# Patient Record
Sex: Male | Born: 1985 | Race: White | Hispanic: No | Marital: Single | Smoking: Current every day smoker
Health system: Southern US, Community
[De-identification: ages and names within clinical notes are randomized; demographics above are authoritative.]

---
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
## 2005-11-03 ENCOUNTER — Emergency Department: Payer: Self-pay | Admitting: Unknown Physician Specialty

## 2010-05-30 ENCOUNTER — Emergency Department (HOSPITAL_COMMUNITY)
Admission: EM | Admit: 2010-05-30 | Discharge: 2010-05-30 | Disposition: A | Discharge status: Home / Self Care | Attending: Emergency Medicine | Admitting: Emergency Medicine

## 2010-05-30 ENCOUNTER — Emergency Department (HOSPITAL_COMMUNITY)

## 2010-05-30 DIAGNOSIS — Y9210 Unspecified residential institution as the place of occurrence of the external cause: Secondary | ICD-10-CM | POA: Insufficient documentation

## 2010-05-30 DIAGNOSIS — Y9359 Activity, other involving other sports and athletics played individually: Secondary | ICD-10-CM | POA: Insufficient documentation

## 2010-05-30 DIAGNOSIS — N509 Disorder of male genital organs, unspecified: Secondary | ICD-10-CM | POA: Insufficient documentation

## 2010-05-30 DIAGNOSIS — X500XXA Overexertion from strenuous movement or load, initial encounter: Secondary | ICD-10-CM | POA: Insufficient documentation

## 2010-05-30 DIAGNOSIS — R109 Unspecified abdominal pain: Secondary | ICD-10-CM | POA: Insufficient documentation

## 2010-05-30 DIAGNOSIS — R52 Pain, unspecified: Secondary | ICD-10-CM

## 2010-05-30 DIAGNOSIS — Y998 Other external cause status: Secondary | ICD-10-CM | POA: Insufficient documentation

## 2010-05-30 DIAGNOSIS — T148XXA Other injury of unspecified body region, initial encounter: Secondary | ICD-10-CM | POA: Insufficient documentation

## 2010-05-30 LAB — URINALYSIS, ROUTINE W REFLEX MICROSCOPIC
Bilirubin Urine: NEGATIVE
Glucose, UA: NEGATIVE mg/dL
Hgb urine dipstick: NEGATIVE
Ketones, ur: NEGATIVE mg/dL
Nitrite: NEGATIVE
Protein, ur: NEGATIVE mg/dL
Specific Gravity, Urine: 1.01 (ref 1.005–1.030)
Urobilinogen, UA: 0.2 mg/dL (ref 0.0–1.0)
pH: 5.5 (ref 5.0–8.0)

## 2010-05-30 LAB — DIFFERENTIAL
Basophils Absolute: 0 10*3/uL (ref 0.0–0.1)
Basophils Relative: 0 % (ref 0–1)
Eosinophils Absolute: 0.2 10*3/uL (ref 0.0–0.7)
Eosinophils Relative: 2 % (ref 0–5)
Lymphocytes Relative: 33 % (ref 12–46)
Lymphs Abs: 3.1 10*3/uL (ref 0.7–4.0)
Monocytes Absolute: 0.6 10*3/uL (ref 0.1–1.0)
Monocytes Relative: 7 % (ref 3–12)
Neutro Abs: 5.3 10*3/uL (ref 1.7–7.7)
Neutrophils Relative %: 58 % (ref 43–77)

## 2010-05-30 LAB — CBC
HCT: 44.2 % (ref 39.0–52.0)
Hemoglobin: 15.2 g/dL (ref 13.0–17.0)
MCH: 28.4 pg (ref 26.0–34.0)
MCHC: 34.4 g/dL (ref 30.0–36.0)
MCV: 82.5 fL (ref 78.0–100.0)
Platelets: 209 10*3/uL (ref 150–400)
RBC: 5.36 MIL/uL (ref 4.22–5.81)
RDW: 12.8 % (ref 11.5–15.5)
WBC: 9.2 10*3/uL (ref 4.0–10.5)

## 2010-05-30 LAB — BASIC METABOLIC PANEL
BUN: 8 mg/dL (ref 6–23)
CO2: 28 mEq/L (ref 19–32)
Calcium: 9.8 mg/dL (ref 8.4–10.5)
Chloride: 103 mEq/L (ref 96–112)
Creatinine, Ser: 0.87 mg/dL (ref 0.4–1.5)
GFR calc Af Amer: >60 mL/min (ref >60)
GFR calc non Af Amer: >60 mL/min (ref >60)
Glucose, Bld: 91 mg/dL (ref 70–99)
Potassium: 3.7 mEq/L (ref 3.5–5.1)
Sodium: 137 mEq/L (ref 135–145)

## 2011-12-02 ENCOUNTER — Ambulatory Visit: Payer: Self-pay | Admitting: General Practice

## 2011-12-02 LAB — COMPREHENSIVE METABOLIC PANEL
Albumin: 4.1 g/dL (ref 3.4–5.0)
Alkaline Phosphatase: 64 U/L (ref 50–136)
Anion Gap: 5 — ABNORMAL LOW (ref 7–16)
Bilirubin,Total: 0.5 mg/dL (ref 0.2–1.0)
Calcium, Total: 9.1 mg/dL (ref 8.5–10.1)
Chloride: 107 mmol/L (ref 98–107)
Co2: 28 mmol/L (ref 21–32)
EGFR (African American): >60
Potassium: 3.8 mmol/L (ref 3.5–5.1)
SGOT(AST): 33 U/L (ref 15–37)
SGPT (ALT): 52 U/L (ref 12–78)
Total Protein: 7.5 g/dL (ref 6.4–8.2)

## 2011-12-02 LAB — CBC
HCT: 46.7 % (ref 40.0–52.0)
HGB: 16.3 g/dL (ref 13.0–18.0)
RDW: 13.4 % (ref 11.5–14.5)
WBC: 13.2 10*3/uL — ABNORMAL HIGH (ref 3.8–10.6)

## 2011-12-02 LAB — PROTIME-INR
INR: 0.9
Prothrombin Time: 12.8 secs (ref 11.5–14.7)

## 2012-09-07 ENCOUNTER — Emergency Department: Payer: Self-pay | Admitting: Emergency Medicine

## 2012-09-07 LAB — COMPREHENSIVE METABOLIC PANEL
Alkaline Phosphatase: 67 U/L (ref 50–136)
Anion Gap: 3 — ABNORMAL LOW (ref 7–16)
Co2: 31 mmol/L (ref 21–32)
EGFR (Non-African Amer.): >60
Glucose: 103 mg/dL — ABNORMAL HIGH (ref 65–99)
Osmolality: 272 (ref 275–301)

## 2012-09-07 LAB — CBC
HGB: 16.7 g/dL (ref 13.0–18.0)
MCV: 83 fL (ref 80–100)
Platelet: 256 10*3/uL (ref 150–440)
RDW: 12.9 % (ref 11.5–14.5)

## 2012-09-07 LAB — URINALYSIS, COMPLETE
Bacteria: NONE SEEN
RBC,UR: <1 /HPF (ref 0–5)
Squamous Epithelial: NONE SEEN

## 2012-09-07 LAB — ETHANOL: Ethanol %: <0.003 % (ref 0.000–0.080)

## 2012-09-08 LAB — DRUG SCREEN, URINE
Barbiturates, Ur Screen: NEGATIVE (ref ?–200)
Cannabinoid 50 Ng, Ur ~~LOC~~: POSITIVE (ref ?–50)
Methadone, Ur Screen: NEGATIVE (ref ?–300)
Phencyclidine (PCP) Ur S: NEGATIVE (ref ?–25)

## 2012-10-02 IMAGING — US US SCROTUM
1 series · 14 of 25 positions shown · non-contrast
Comparison: 05/30/2010

CLINICAL DATA: Right scrotal pain

ULTRASOUND OF SCROTUM
ULTRASOUND DOPPLER SCROTUM
TECHNIQUE: Complete ultrasound examination of the testicles,
epididymis, and other scrotal structures was performed including
scrotal Doppler exam.

[Series 1: us scrotum · 0.08mm/px · 14 of 65 slices shown]
[im 1/65]
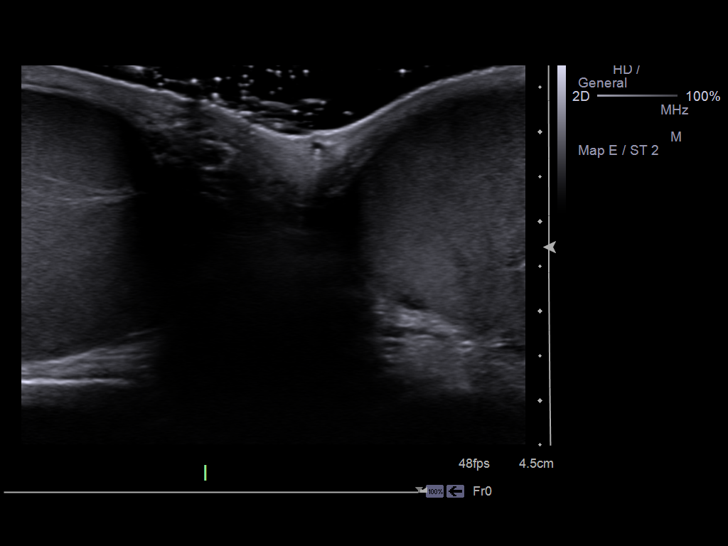
[im 6/65]
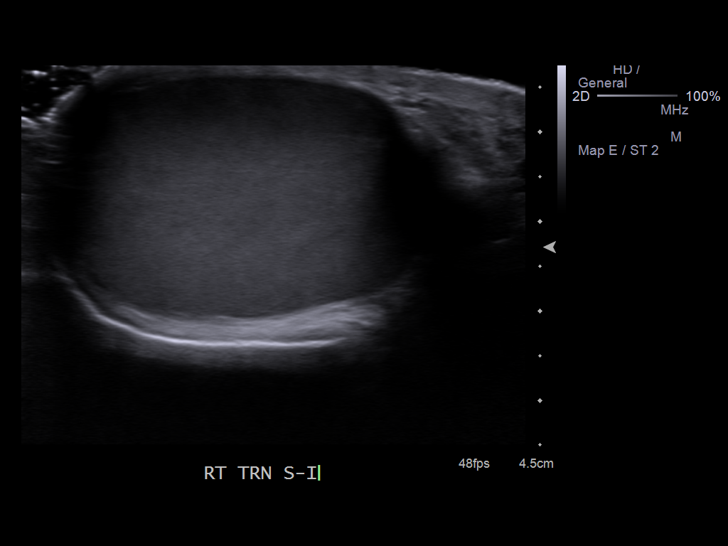
[im 11/65]
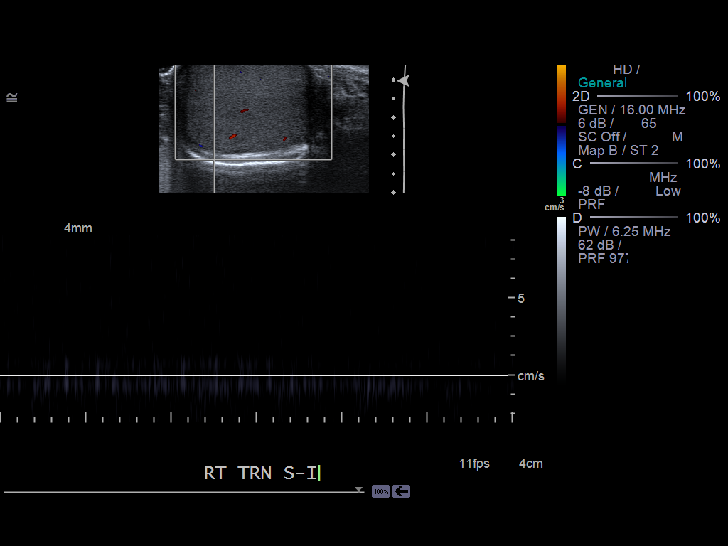
[im 17/65]
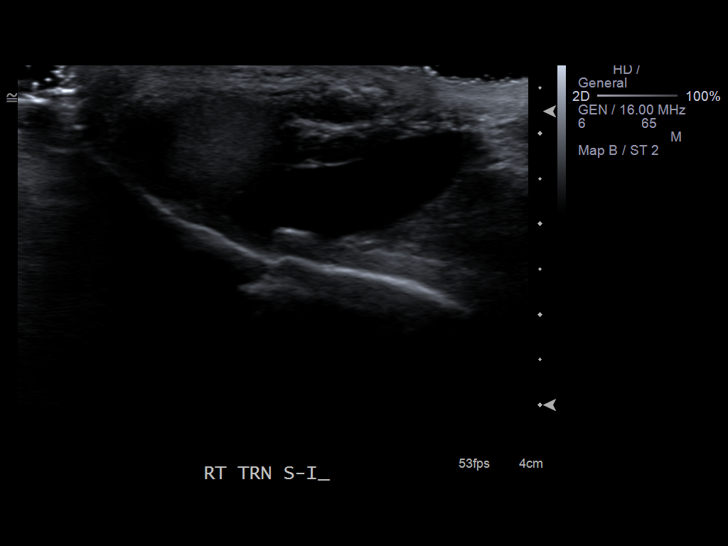
[im 22/65]
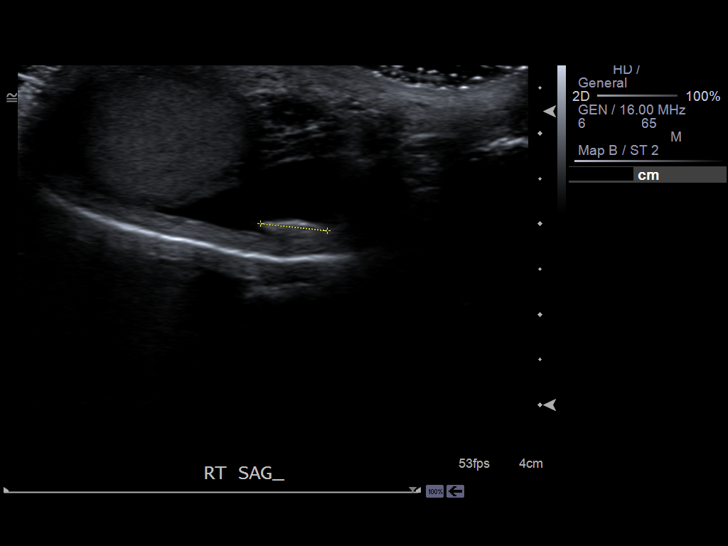
[im 25/65]
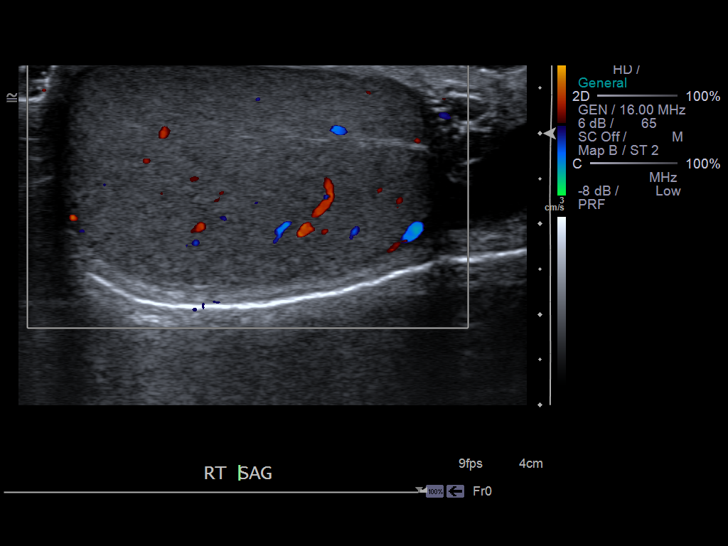
[im 30/65]
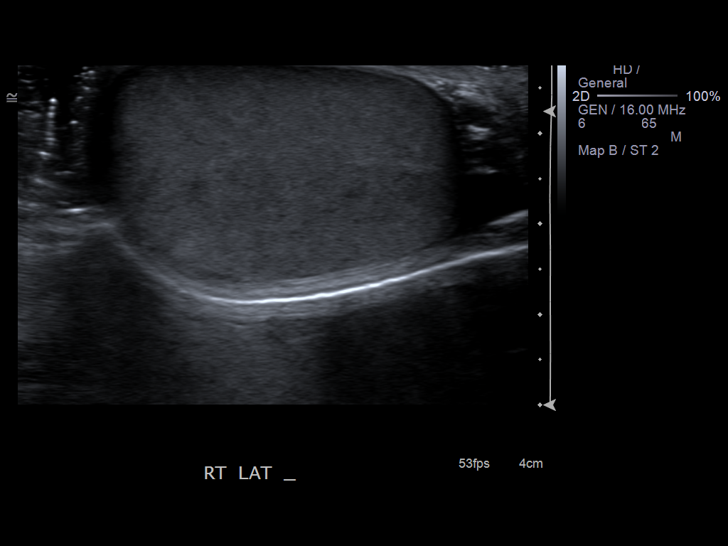
[im 35/65]
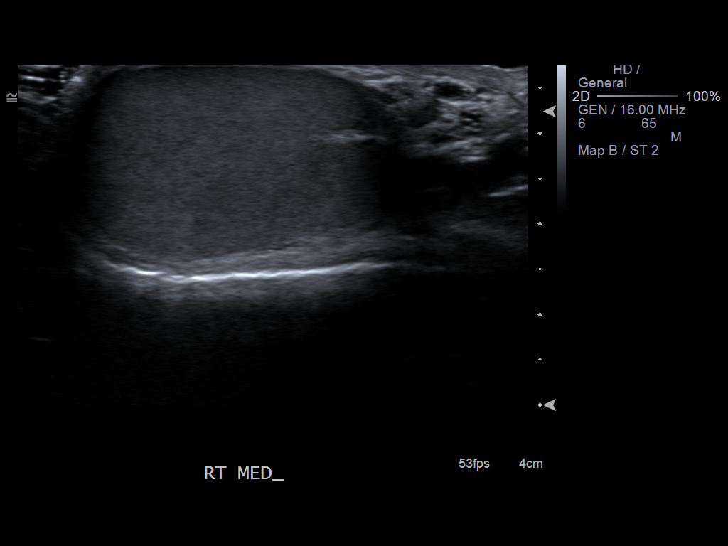
[im 41/65]
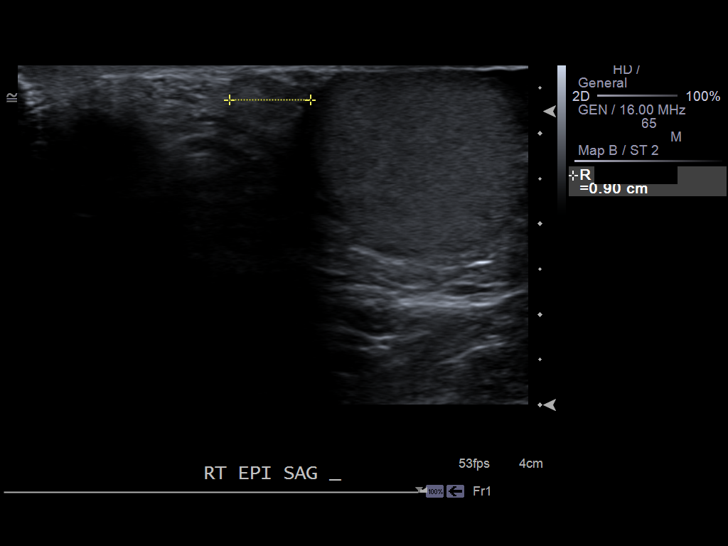
[im 43/65]
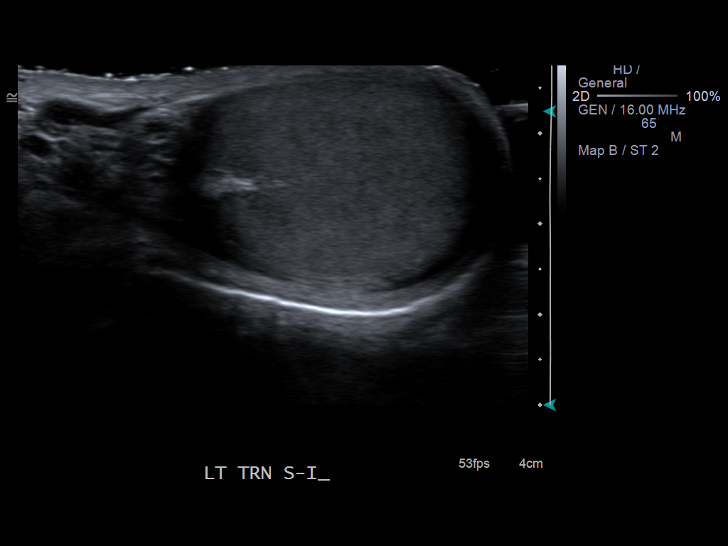
[im 49/65]
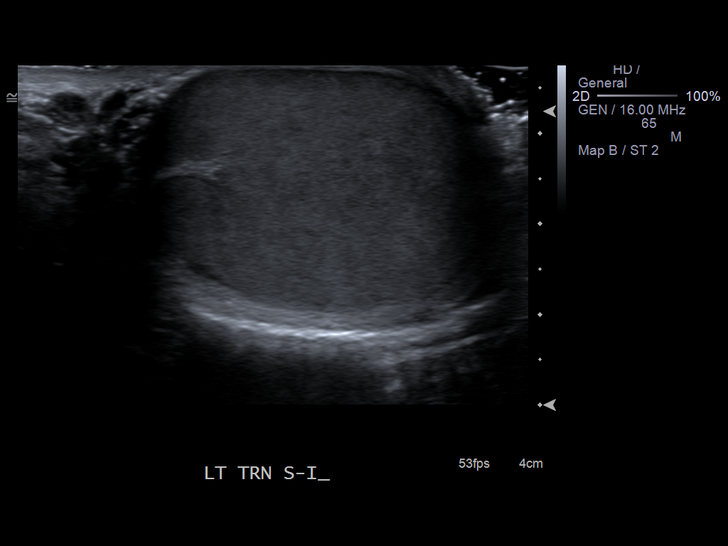
[im 54/65]
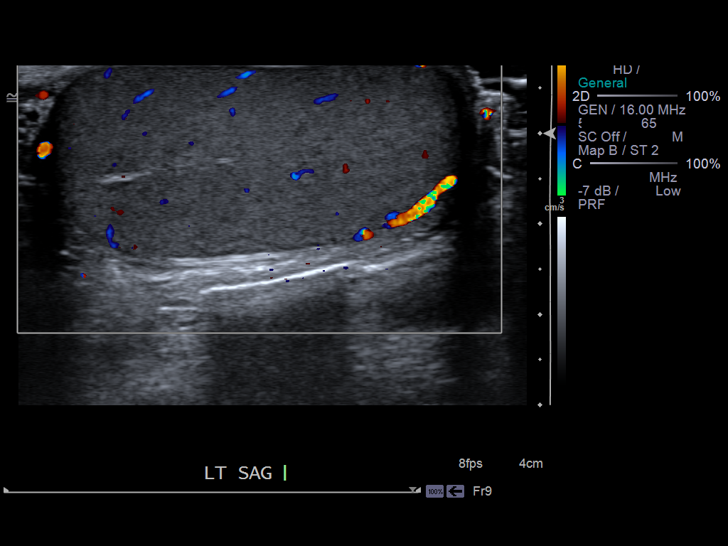
[im 59/65]
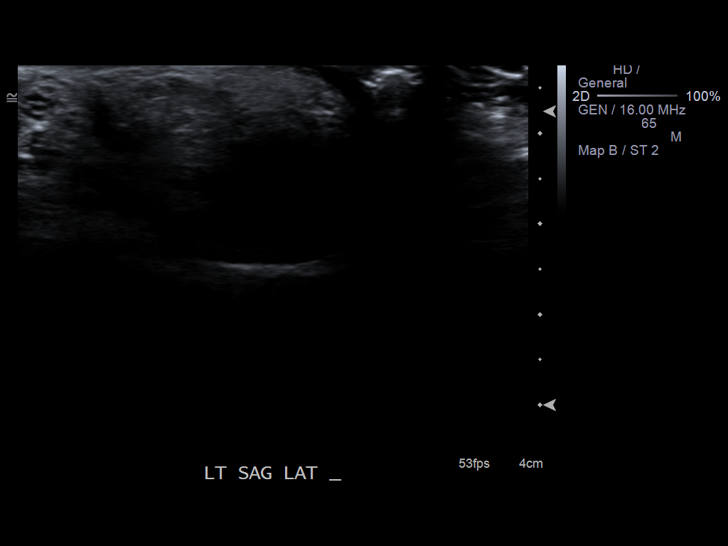
[im 65/65]
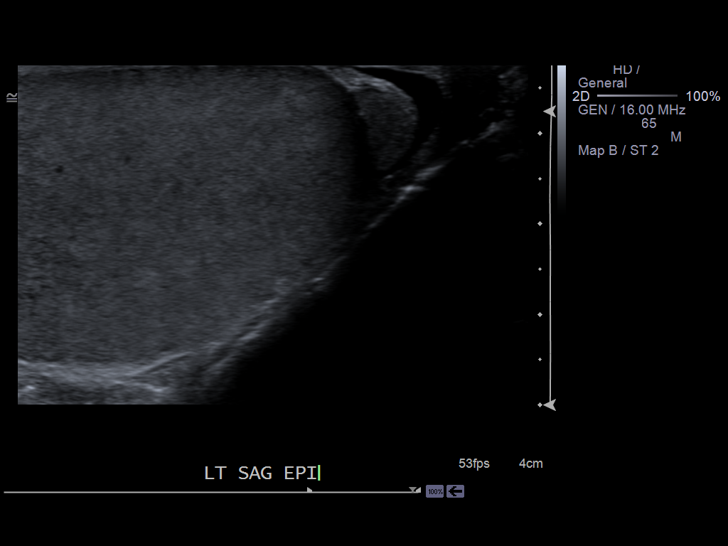

[14 of 25 positions shown; findings below may reference images not displayed]

FINDINGS: Right testis:  Normal homogeneous echotexture.  No intratesticular
mass or focal abnormality.  Right testicle measures 4.5 x 2.6 x
cm.  Normal color Doppler flow with arterial and venous waveforms
detected.

Left testis:  Normal homogeneous echotexture.  No intratesticular
mass or focal abnormality.  Left testicle measures 4.5 x 2.6 x
cm also.  Normal color Doppler flow and arterial and venous
waveforms detected.

Right epididymis:  Normal in size and appearance.

Left epididymis:  Normal in size and appearance.

Hydrocele:  Small hydrocele on the right noted which appears
simple.

Varicocele:  Absent

Echogenic shadowing right hemi scrotum calcification noted
measuring 7 mm consistent with a "scrotal pearl".

Pulsed Doppler interrogatioin of both testes demonstrates low
resistance flow bilaterally.
IMPRESSION: No acute finding by ultrasound.
Trace right hydrocele
No evidence of torsion

## 2014-04-06 IMAGING — CR RIGHT HAND - COMPLETE 3+ VIEW
1 series · 3 of 3 positions shown · non-contrast
Comparison: none

REASON FOR EXAM: s/p accident
COMMENTS:

[Series 1: pa · 0.17mm/px · 3 of 3 slices shown]
[im 1/3]
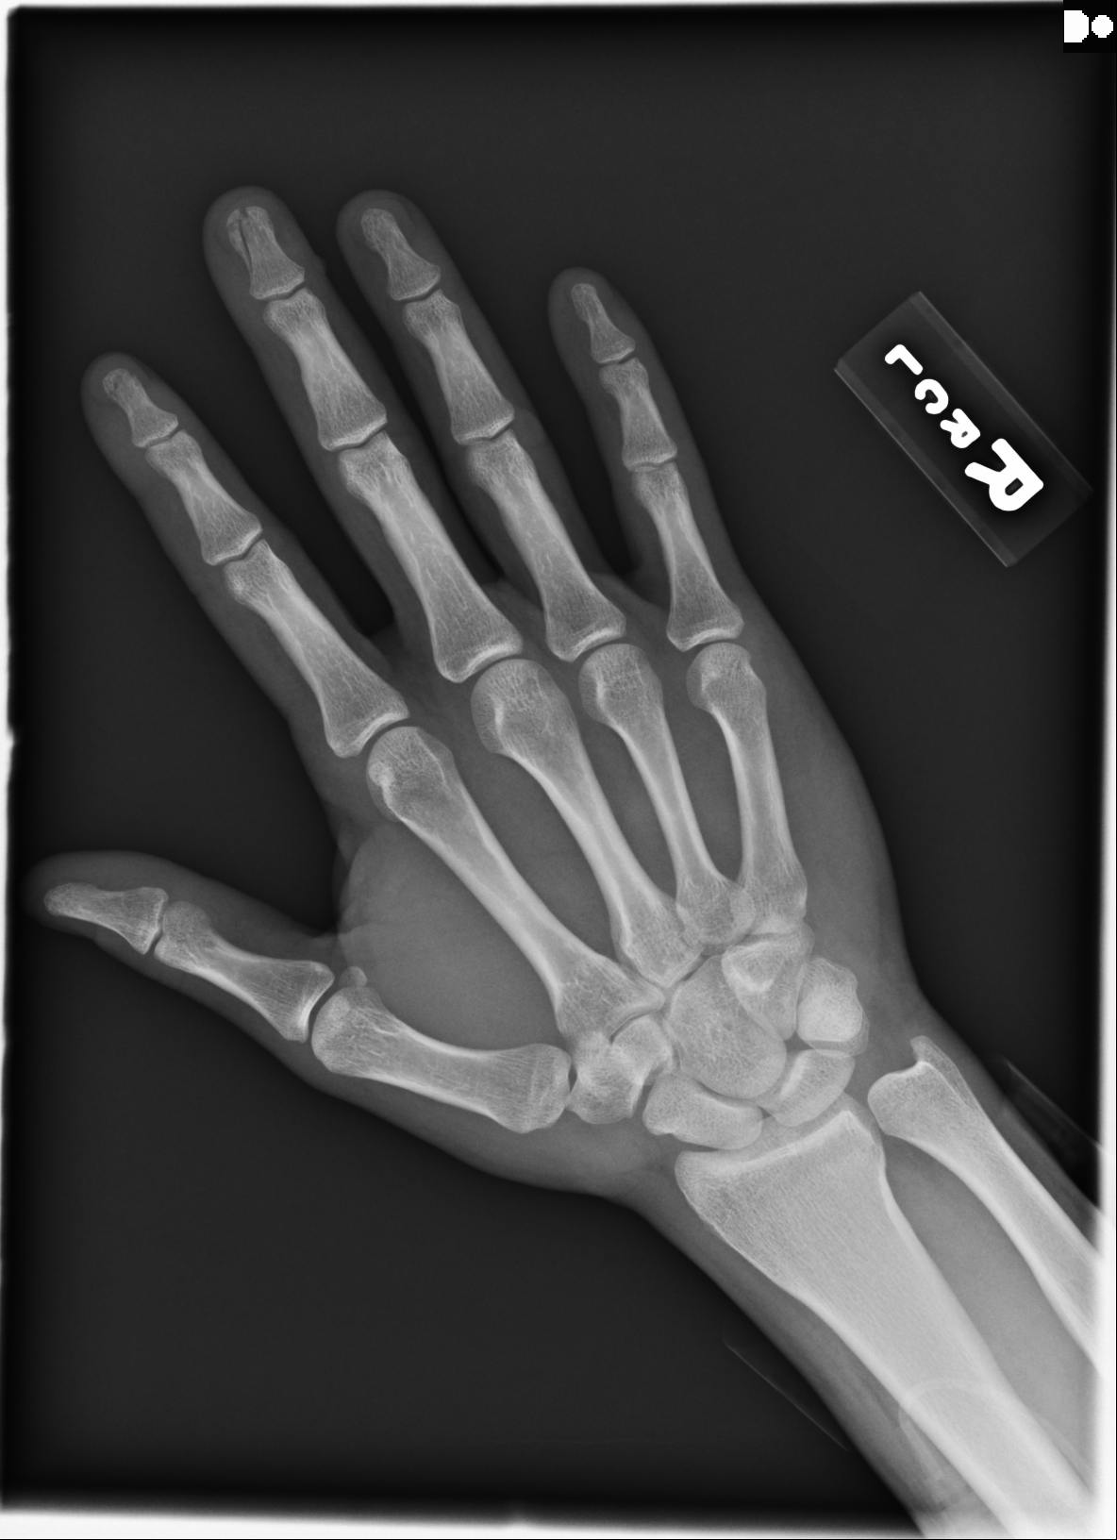
[im 2/3]
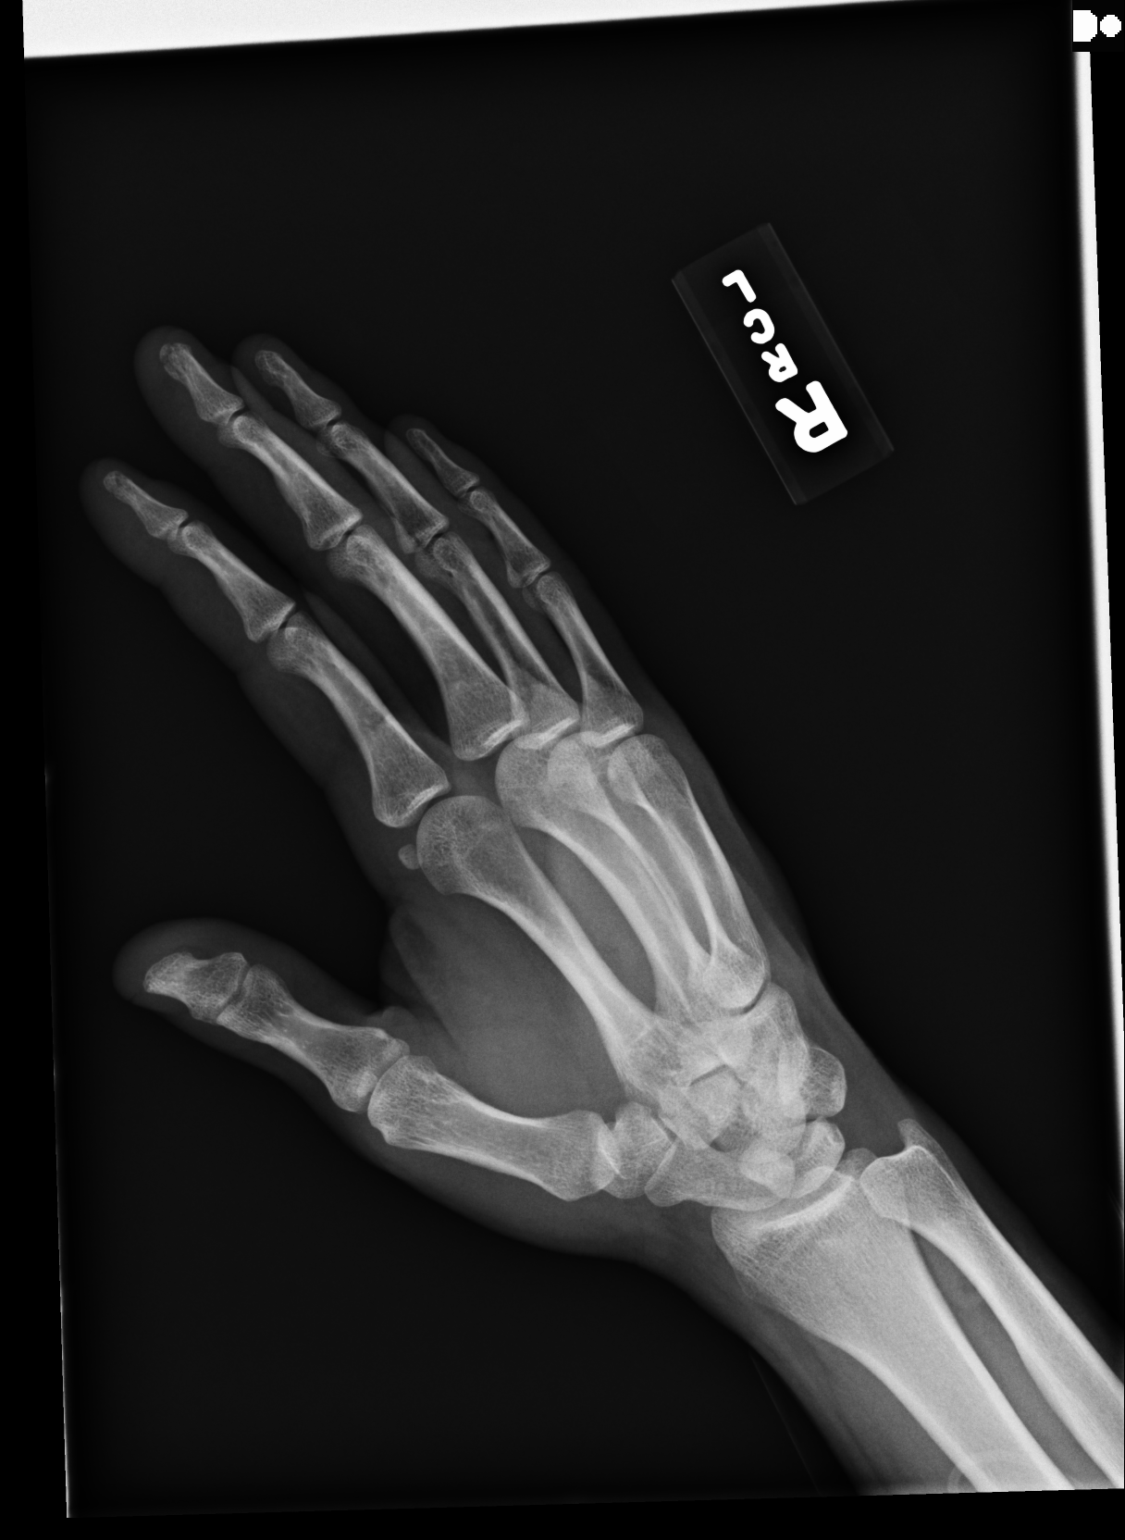
[im 3/3]
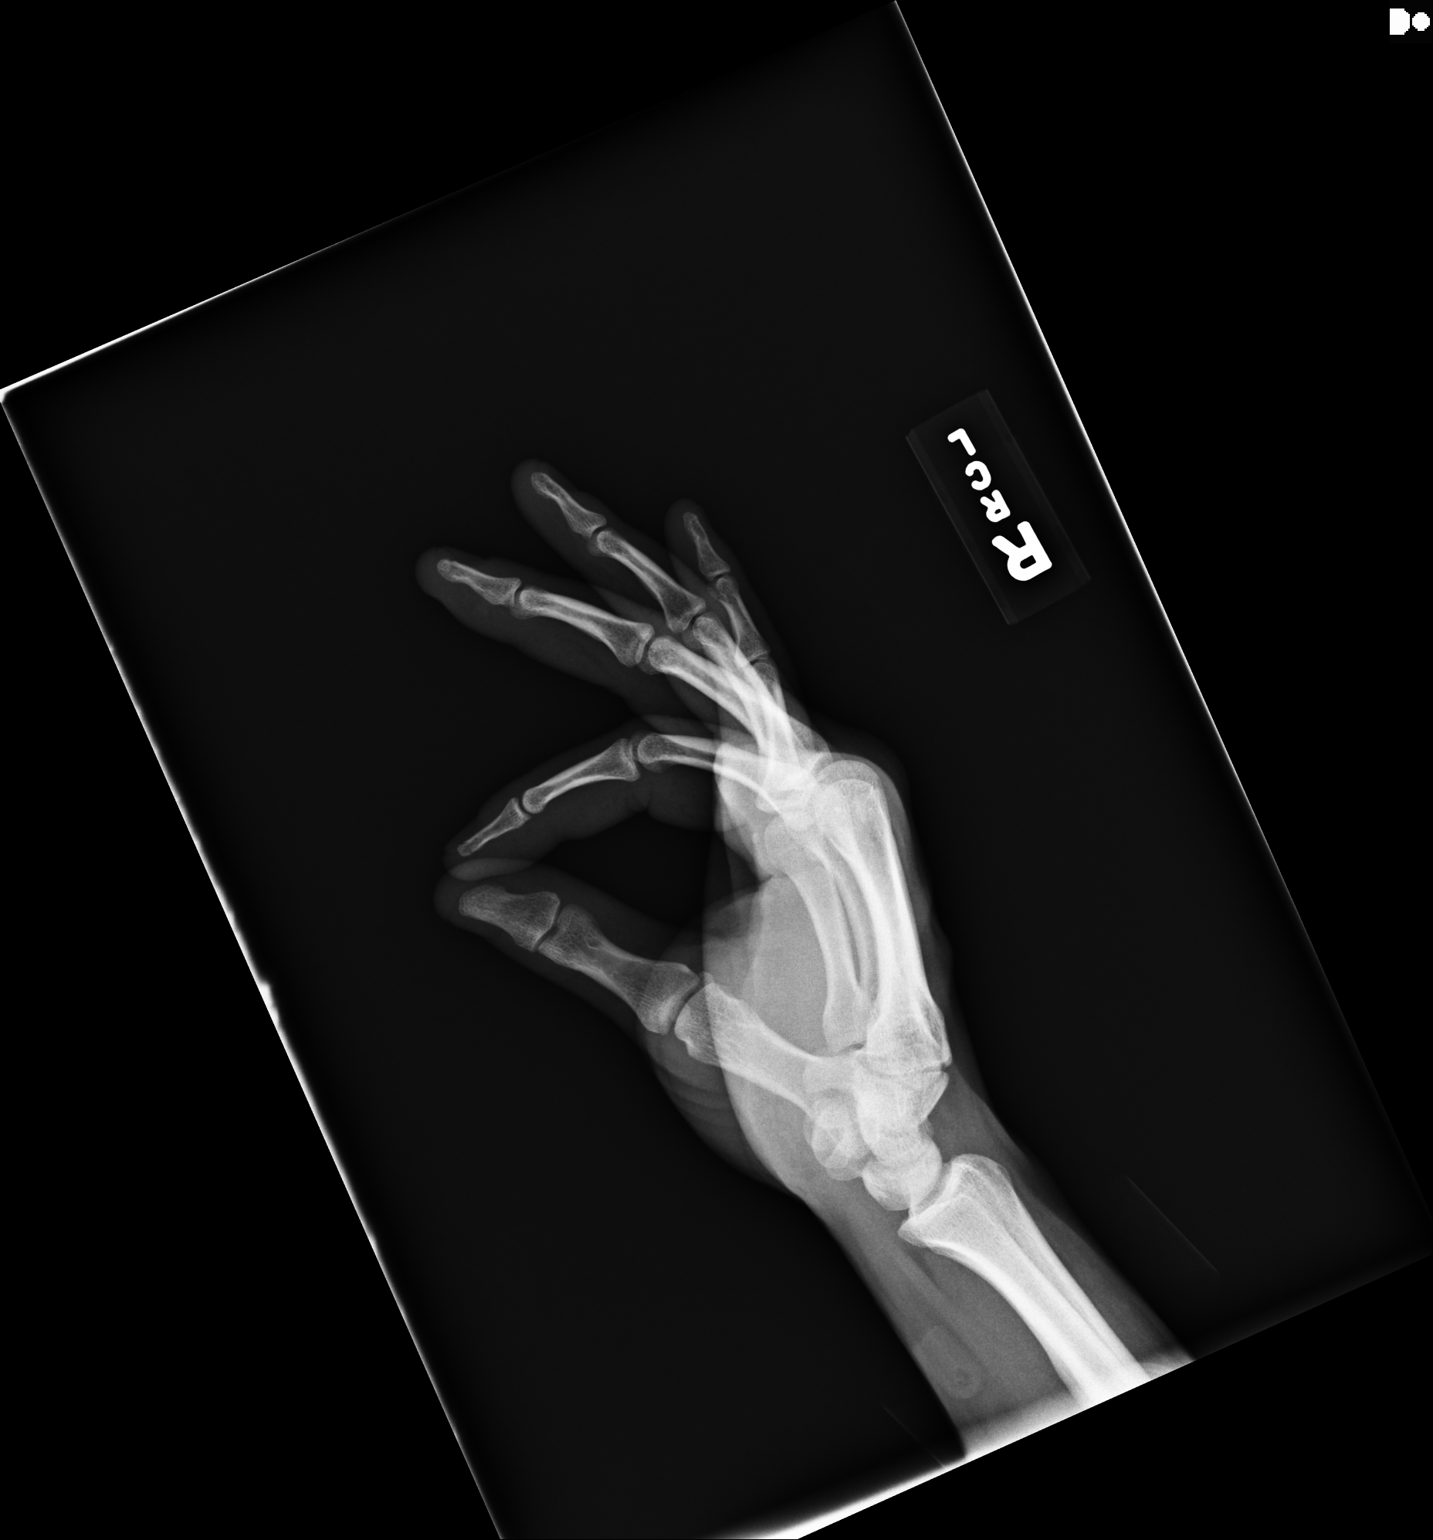

[3 of 3 positions shown; findings below may reference images not displayed]

PROCEDURE:     DXR - DXR HAND RT COMPLETE W/OBLIQUES  - December 02, 2011  [DATE]

RESULT:     Three views of the right hand are submitted. The patient has
sustained obliquely oriented fractures through the distal aspects of the
distal phalanges of the index and third finger. The overlying soft tissues
are grossly intact. The proximal and middle phalanges appear intact as do
the carpal bones and metacarpals.
IMPRESSION: The patient has sustained a nondisplaced obliquely oriented
fracture through the tips of the distal phalanges of the second and third
fingers.

[REDACTED]

## 2014-05-10 NOTE — Op Note (Signed)
PATIENT NAME:  Athena MasseBOSWELL, Misael MR#:  [id number] DATE OF BIRTH:  [birth year]  DATE OF PROCEDURE:  12/02/2011  PREOPERATIVE DIAGNOSIS: Crush injury to the distal aspect of the left long finger.   POSTOPERATIVE DIAGNOSIS:  Crush injury to the distal aspect of the left long finger.    PROCEDURE PERFORMED: Irrigation and debridement of the distal aspect of the left long finger.   SURGEON: Illene LabradorJames P. Hooten, MD   ANESTHESIA: General.   ESTIMATED BLOOD LOSS: Minimal.   FLUIDS REPLACED: 1100 mL of crystalloid.   TOURNIQUET TIME: Not used.   DRAINS: None.   INDICATIONS FOR SURGERY: The patient is a 29 year old right-hand dominant male who sustained an injury at work when a log apparently fell on the distal aspect of the left long finger. [facility] with an open lesion. The nail was intact for most of its extent. After discussion of the risks and benefits of surgical intervention, the patient expressed his understanding of the risks and benefits and agreed with plans for surgical intervention.   PROCEDURE IN DETAIL: The patient was brought into the operating room and, after adequate general anesthesia was achieved, a tourniquet was placed on the patient's upper left arm. The patient's left hand and arm were cleaned and prepped with Betadine and draped in the usual sterile fashion. A "time-out" was performed as per usual protocol. Loupe magnification was used throughout procedure. The volar soft tissue flap was elevated and the wound was irrigated with copious amounts of normal saline with antibiotic solution using an Angiocath catheter. The distal aspect of the distal phalanx was visible and somewhat prominent. A rongeur was used to trim and debride the distal aspect of the distal phalanx of the left long finger. There had been some disruption of the nail matrix and tenotomy scissors were used to remove the fragmented portion. The wound was again irrigated with copious amounts of  normal saline with antibiotic solution. The flap was then loosely reapproximated using interrupted sutures of #4-0 nylon. Good coverage of the distal phalanx was appreciated. A digital block was completed using 0.5% Marcaine. Sterile dressing was applied.   The patient tolerated the procedure well. He was transported to the recovery room in stable condition.     ____________________________ Illene LabradorJames P. Angie FavaHooten Jr., MD jph:bjt D: 12/03/2011 02:53:16 ET T: 12/03/2011 10:54:59 ET JOB#: [id number]  cc: Fayrene FearingJames P. Angie FavaHooten Jr., MD, <Dictator> Illene LabradorJAMES P Angie FavaHOOTEN JR MD ELECTRONICALLY SIGNED 12/04/2011 4:43

## 2015-03-31 ENCOUNTER — Emergency Department
Admission: EM | Admit: 2015-03-31 | Discharge: 2015-03-31 | Discharge status: Against Medical Advice | Attending: Emergency Medicine | Admitting: Emergency Medicine

## 2015-03-31 ENCOUNTER — Encounter: Payer: Self-pay | Admitting: Medical Oncology

## 2015-03-31 DIAGNOSIS — F17200 Nicotine dependence, unspecified, uncomplicated: Secondary | ICD-10-CM | POA: Insufficient documentation

## 2015-03-31 DIAGNOSIS — B349 Viral infection, unspecified: Secondary | ICD-10-CM | POA: Insufficient documentation

## 2015-03-31 LAB — RAPID INFLUENZA A&B ANTIGENS
Influenza A (ARMC): NEGATIVE
Influenza B (ARMC): NEGATIVE

## 2015-03-31 MED ORDER — IBUPROFEN 600 MG PO TABS
600.0000 mg | ORAL_TABLET | Freq: Once | ORAL | Status: AC
  Administered 2015-03-31: 600 mg via ORAL
  Filled 2015-03-31: qty 1

## 2015-03-31 NOTE — ED Notes (Signed)
Patient c/o body aches, headache, congestion, nausea, cough, chills.

## 2015-03-31 NOTE — ED Notes (Signed)
Pt reports he has been having cough, fever, body aches since yesterday.

## 2015-03-31 NOTE — ED Provider Notes (Signed)
[facility] Emergency Department Provider Note ____________________________________________  Time seen: Approximately 9:32 AM  I have reviewed the triage vital signs and the nursing notes.   HISTORY  Chief Complaint Cough and Fever   HPI Timothy Rangel is a 30 y.o. male is here with complaint of body aches, headache, congestion, nausea, cough and chills. He says states that he did not get the flu shot. He states that congestion came on gradually however his body aches and fever or sudden onset yesterday. He has taken over-the-counter medication with minimal results. He states last night he had tremendous body aches. He denies any nausea or vomiting. There is been no diarrhea. Currently he rates his pain as 4 out of 10.   No past medical history on file.  There are no active problems to display for this patient.   No past surgical history on file.  No current outpatient prescriptions on file.  Allergies Review of patient's allergies indicates no known allergies.  No family history on file.  Social History Social History  Substance Use Topics  . Smoking status: Current Every Day Smoker  . Smokeless tobacco: Not on file  . Alcohol Use: Not on file    Review of Systems Constitutional: Positive fever/chills Eyes: No visual changes. ENT: No sore throat. Cardiovascular: Denies chest pain. Respiratory: Denies shortness of breath. Gastrointestinal: No abdominal pain.  No nausea, no vomiting.  No diarrhea.   Genitourinary: Negative for dysuria. Musculoskeletal: Positive for body aches Skin: Negative for rash. Neurological: Positive for headaches, no focal weakness or numbness.  10-point ROS otherwise negative.  ____________________________________________   PHYSICAL EXAM:  VITAL SIGNS: ED Triage Vitals  Enc Vitals Group     BP 03/31/15 0730 149/89 mmHg     Pulse Rate 03/31/15 0730 87     Resp 03/31/15 0730 18     Temp 03/31/15 0730 97.7  F (36.5 C)     Temp Source 03/31/15 0730 Oral     SpO2 03/31/15 0730 99 %     Weight 03/31/15 0730 175 lb (79.379 kg)     Height 03/31/15 0730  (1.803 m)     Head Cir --      Peak Flow --      Pain Score 03/31/15 0730 4     Pain Loc --      Pain Edu? --      Excl. in GC? --     Constitutional: Alert and oriented. Well appearing and in no acute distress. Eyes: Conjunctivae are normal. PERRL. EOMI. Head: Atraumatic. Nose: Moderate congestion/rhinnorhea. Mouth/Throat: Mucous membranes are moist.  Oropharynx non-erythematous. Neck: No stridor.   Hematological/Lymphatic/Immunilogical: No cervical lymphadenopathy. Cardiovascular: Normal rate, regular rhythm. Grossly normal heart sounds.  Good peripheral circulation. Respiratory: Normal respiratory effort.  No retractions. Lungs CTAB. Gastrointestinal: Soft and nontender. No distention.  Musculoskeletal: Moves upper and lower extremities without difficulty. Normal gait was noted. Neurologic:  Normal speech and language. No gross focal neurologic deficits are appreciated. No gait instability. Skin:  Skin is warm, dry and intact. No rash noted. Psychiatric: Mood and affect are normal. Speech and behavior are normal.  ____________________________________________   LABS (all labs ordered are listed, but only abnormal results are displayed)  Labs Reviewed  RAPID INFLUENZA A&B ANTIGENS ([facility] ONLY)    PROCEDURES  Procedure(s) performed: None  Critical Care performed: No  ____________________________________________   INITIAL IMPRESSION / ASSESSMENT AND PLAN / ED COURSE  Pertinent labs & imaging results that were available  during my care of the patient were reviewed by me and considered in my medical decision making (see chart for details).  Patient left AMA prior to his flu test results findings. Patient was called at home and told that his influenza test was negative. He will continue on his over-the-counter medication  for his congestion and Tylenol or ibuprofen as needed for body aches and chills. ____________________________________________   FINAL CLINICAL IMPRESSION(S) / ED DIAGNOSES  Final diagnoses:  Viral illness      Tommi RumpsRhonda L Summers, PA-C 03/31/15 1217  Sharyn CreamerMark Quale, MD 03/31/15 [phone]

## 2016-03-15 ENCOUNTER — Telehealth (HOSPITAL_COMMUNITY): Payer: Self-pay | Admitting: Licensed Clinical Social Worker

## 2016-03-15 ENCOUNTER — Ambulatory Visit (HOSPITAL_COMMUNITY): Admitting: Licensed Clinical Social Worker

## 2016-03-15 NOTE — Telephone Encounter (Signed)
Patient called to say he overslept and will not be able to make appointment. Counselor rescheduled for Monday 2/26 at 10AM for [facility]. Pt stated he is interested in MAT and/or sobriety based services. Counselor advised him to attend an AA or NA meeting this weekend to meet others in recovery.

## 2016-03-18 ENCOUNTER — Ambulatory Visit (HOSPITAL_COMMUNITY): Admitting: Licensed Clinical Social Worker

## 2021-05-10 ENCOUNTER — Encounter: Payer: Self-pay | Admitting: Family Medicine

## 2021-05-10 ENCOUNTER — Ambulatory Visit: Payer: Self-pay | Admitting: Family Medicine

## 2021-05-10 DIAGNOSIS — Z113 Encounter for screening for infections with a predominantly sexual mode of transmission: Secondary | ICD-10-CM

## 2021-05-10 DIAGNOSIS — F11920 Opioid use, unspecified with intoxication, uncomplicated: Secondary | ICD-10-CM | POA: Insufficient documentation

## 2021-05-10 DIAGNOSIS — F1910 Other psychoactive substance abuse, uncomplicated: Secondary | ICD-10-CM

## 2021-05-10 LAB — HEPATITIS B SURFACE ANTIGEN

## 2021-05-10 LAB — HM HIV SCREENING LAB: HM HIV Screening: NEGATIVE

## 2021-05-10 NOTE — Progress Notes (Signed)
[facility] Department ?STI clinic/screening visit ? ?Subjective:  ?Timothy Rangel is a 36 y.o. male being seen today for an STI screening visit. The patient reports they do not have symptoms.   ? ?Patient has the following medical conditions:  There are no problems to display for this patient. ? ? ? ?Chief Complaint  ?Patient presents with  ? SEXUALLY TRANSMITTED DISEASE  ?  Screening  ? ? ?HPI ? ?Patient reports here for screening, needs testing for HIV for rehab  ? ?Does the patient or their partner desires a pregnancy in the next year? No ? ?Screening for MPX risk: ?Does the patient have an unexplained rash? No ?Is the patient MSM? No ?Does the patient endorse multiple sex partners or anonymous sex partners? No ?Did the patient have close or sexual contact with a person diagnosed with MPX? No ?Has the patient traveled outside the [place] where MPX is endemic? No ?Is there a high clinical suspicion for MPX-- evidenced by one of the following No ? -Unlikely to be chickenpox ? -Lymphadenopathy ? -Rash that present in same phase of evolution on any given body part ? ? ?See flowsheet for further details and programmatic requirements.  ? ? ?The following portions of the patient's history were reviewed and updated as appropriate: allergies, current medications, past medical history, past social history, past surgical history and problem list. ? ?Objective:  ?There were no vitals filed for this visit. ? ?Physical Exam ?Constitutional:   ?   Appearance: Normal appearance.  ?HENT:  ?   Head: Normocephalic.  ?   Mouth/Throat:  ?   Mouth: Mucous membranes are moist.  ?   Pharynx: Oropharynx is clear. No oropharyngeal exudate.  ?Eyes:  ?   Comments: Pin point pupils - bilaterally   ?Pulmonary:  ?   Effort: Pulmonary effort is normal.  ?Genitourinary: ?   Comments: Deferred  ?Musculoskeletal:  ?   Cervical back: Normal range of motion.  ?Lymphadenopathy:  ?   Cervical: No cervical adenopathy.  ?Skin: ?   General: Skin  is warm and dry.  ?   Findings: No bruising, erythema, lesion or rash.  ?Neurological:  ?   Mental Status: He is alert and oriented to person, place, and time.  ?Psychiatric:     ?   Mood and Affect: Mood normal.     ?   Speech: Speech normal.     ?   Behavior: Behavior is slowed.     ?   Cognition and Memory: Cognition normal.     ?   Judgment: Judgment normal.  ? ? ? ? ?Assessment and Plan:  ?Timothy Rangel is a 36 y.o. male presenting to the [facility] Department for STI screening ? ?1. Screening examination for venereal disease ?Patient does not have STI symptoms ?Patient declined all screenings including  gram stain, oral, urine, rectal CT/GC and accepted bloodwork for HIV/RPR.  ?Patient meets criteria for HepB screening? Yes. Ordered? Yes ?Patient meets criteria for HepC screening? Yes. Ordered? No - hcv + history  ?Recommended condom use with all sex ?Discussed importance of condom use for STI prevent ? ?Discussed time line for State Lab results and that patient will be called with positive results and encouraged patient to call if he had not heard in 2 weeks ?Recommended returning for continued or worsening symptoms.  ? ?- Syphilis Serology, [facility] ?- HIV  LAB ?- HBV Antigen/Antibody State Lab ? ?2. Substance abuse (HCC) ?Pt  here for testing for rehab admission, history of substance abuse (heroin)  ? ? ?3. Intoxication with opioids, uncomplicated (HCC) ?Currently intoxicated at this visit.   ?Declined narcan ? ? ? ?Return for as needed. ? ?No future appointments. ? ?Wendi Snipes, FNP ?

## 2022-07-18 ENCOUNTER — Emergency Department
Admission: EM | Admit: 2022-07-18 | Discharge: 2022-07-18 | Disposition: A | Discharge status: Home / Self Care | Payer: Medicaid Other | Attending: Emergency Medicine | Admitting: Emergency Medicine

## 2022-07-18 ENCOUNTER — Other Ambulatory Visit: Payer: Self-pay

## 2022-07-18 ENCOUNTER — Emergency Department: Payer: Medicaid Other

## 2022-07-18 DIAGNOSIS — S59911A Unspecified injury of right forearm, initial encounter: Secondary | ICD-10-CM | POA: Diagnosis present

## 2022-07-18 DIAGNOSIS — S51811A Laceration without foreign body of right forearm, initial encounter: Secondary | ICD-10-CM | POA: Diagnosis not present

## 2022-07-18 DIAGNOSIS — W25XXXA Contact with sharp glass, initial encounter: Secondary | ICD-10-CM | POA: Diagnosis not present

## 2022-07-18 MED ORDER — LIDOCAINE-EPINEPHRINE 2 %-1:100000 IJ SOLN
20.0000 mL | Freq: Once | INTRAMUSCULAR | Status: AC
  Administered 2022-07-18: 20 mL via INTRADERMAL
  Filled 2022-07-18: qty 1

## 2022-07-18 MED ORDER — HYDROCODONE-ACETAMINOPHEN 5-325 MG PO TABS
1.0000 | ORAL_TABLET | Freq: Once | ORAL | Status: AC
  Administered 2022-07-18: 1 via ORAL
  Filled 2022-07-18: qty 1

## 2022-07-18 NOTE — Discharge Instructions (Signed)
Keep wound clean and dry, gently wash with soap and water daily. Do not soak in bath tub or hot tub. Change dressing daily. Return to care provider in 7-10 days for suture removal.

## 2022-07-18 NOTE — ED Triage Notes (Signed)
Pt here with a right forearm laceration after cutting it on glass. Pt still bleeding profusely, forearm wrapped in abd pad and gauze. Pt ambulatory to triage.

## 2022-07-18 NOTE — ED Provider Notes (Signed)
[facility] Provider Note    Event Date/Time   First MD Initiated Contact with Patient 07/18/22 1609     (approximate)   History   Laceration   HPI  Timothy Rangel is a 37 y.o. male who presents for evaluation of a right forearm laceration.  Patient states he punched some glass which is what cut his arm.  He has had a tetanus in the last 5 years.     Physical Exam   Triage Vital Signs: ED Triage Vitals [07/18/22 1436]  Enc Vitals Group     BP 116/72     Pulse Rate 100     Resp 18     Temp 98 F (36.7 C)     Temp Source Oral     SpO2 95 %     Weight 175 lb 0.7 oz (79.4 kg)     Height 5\' 11"  (1.803 m)     Head Circumference      Peak Flow      Pain Score      Pain Loc      Pain Edu?      Excl. in GC?     Most recent vital signs: Vitals:   07/18/22 1436  BP: 116/72  Pulse: 100  Resp: 18  Temp: 98 F (36.7 C)  SpO2: 95%    General: Awake, no distress.  CV:  Good peripheral perfusion.  Resp:  Normal effort.  Abd:  No distention.  Other:  6 cm and 2 cm laceration to the right forearm, patient is neurovascularly intact, full ROM of the elbow and wrist maintained.  Radial pulse 2+ and regular.   ED Results / Procedures / Treatments   Labs (all labs ordered are listed, but only abnormal results are displayed) Labs Reviewed - No data to display   RADIOLOGY  Right forearm x-rays obtained in the ED today.  I interpreted the images as well as reviewed the radiologist report.   PROCEDURES:  Critical Care performed: No  ..Laceration Repair  Date/Time: 07/18/2022 5:15 PM  Performed by: Cameron Ali, PA-C Authorized by: Cameron Ali, PA-C   Consent:    Consent obtained:  Verbal   Consent given by:  Patient   Risks, benefits, and alternatives were discussed: yes     Risks discussed:  Infection, pain, poor cosmetic result and poor wound healing   Alternatives discussed:  No treatment Anesthesia:     Anesthesia method:  Local infiltration   Local anesthetic:  Lidocaine 1% WITH epi Laceration details:    Location:  Shoulder/arm   Shoulder/arm location:  R lower arm   Length (cm):  8   Depth (mm):  8 Pre-procedure details:    Preparation:  Patient was prepped and draped in usual sterile fashion and imaging obtained to evaluate for foreign bodies Exploration:    Limited defect created (wound extended): no     Hemostasis achieved with:  Epinephrine   Imaging outcome: foreign body not noted     Wound exploration: entire depth of wound visualized     Contaminated: no   Treatment:    Area cleansed with:  Povidone-iodine and saline   Amount of cleaning:  Extensive   Irrigation solution:  Sterile saline   Irrigation volume:  30 mL   Irrigation method:  Syringe   Layers/structures repaired:  Deep subcutaneous Deep subcutaneous:    Suture size:  4-0   Suture material:  Vicryl   Suture technique:  Simple interrupted   Number of sutures:  2 Skin repair:    Repair method:  Sutures   Suture size:  4-0   Suture material:  Prolene   Suture technique:  Simple interrupted and running   Number of sutures:  9 Approximation:    Approximation:  Close Repair type:    Repair type:  Intermediate Post-procedure details:    Dressing:  Bulky dressing   Procedure completion:  Tolerated well, no immediate complications    MEDICATIONS ORDERED IN ED: Medications  lidocaine-EPINEPHrine (XYLOCAINE W/EPI) 2 %-1:100000 (with pres) injection 20 mL (20 mLs Intradermal Given 07/18/22 1632)  HYDROcodone-acetaminophen (NORCO/VICODIN) 5-325 MG per tablet 1 tablet (1 tablet Oral Given 07/18/22 1631)     IMPRESSION / MDM / ASSESSMENT AND PLAN / ED COURSE  I reviewed the triage vital signs and the nursing notes.                             Patient presented for evaluation and repair of a right forearm laceration. Differential diagnosis includes, but is not limited to, laceration, retained foreign body,  underlying fracture.  Patient's presentation is most consistent with acute complicated illness / injury requiring diagnostic workup.  Right forearm x-rays were obtained to evaluate for retained foreign body.  I interpreted the images as well as reviewed the radiologist report which does not show evidence of a foreign body.  Patient was given Norco for his pain while in the ED.  See procedure note above for laceration repair details.  Patient was educated on wound care and instructed to follow-up in 7 to 10 days for suture removal.  Patient was agreeable to plan and voiced understanding.  All questions were answered and patient was stable at discharge.      FINAL CLINICAL IMPRESSION(S) / ED DIAGNOSES   Final diagnoses:  Laceration of right forearm, initial encounter     Rx / DC Orders   ED Discharge Orders     None        Note:  This document was prepared using Dragon voice recognition software and may include unintentional dictation errors.   Cameron Ali, PA-C 07/18/22 1732    Pilar Jarvis, MD 07/18/22 [phone]

## 2022-12-07 ENCOUNTER — Emergency Department: Admission: EM | Admit: 2022-12-07 | Discharge: 2022-12-07 | Discharge status: Against Medical Advice | Payer: MEDICAID

## 2022-12-07 NOTE — ED Notes (Signed)
Pt reports does not wish to wait any longer and does not wish to be triaged or evaluated.
